# Patient Record
Sex: Female | Born: 1970 | Race: Black or African American | Hispanic: No | Marital: Married | Smoking: Never smoker
Health system: Southern US, Community
[De-identification: ages and names within clinical notes are randomized; demographics above are authoritative.]

## PROBLEM LIST (undated history)

## (undated) HISTORY — PX: ABDOMINAL HYSTERECTOMY: SHX81

---

## 2014-03-06 ENCOUNTER — Other Ambulatory Visit: Payer: Self-pay | Admitting: Family Medicine

## 2014-03-06 ENCOUNTER — Ambulatory Visit
Admission: RE | Admit: 2014-03-06 | Discharge: 2014-03-06 | Disposition: A | Discharge status: Home / Self Care | Payer: BC Managed Care – HMO | Source: Ambulatory Visit | Attending: Family Medicine | Admitting: Family Medicine

## 2014-03-06 DIAGNOSIS — M25552 Pain in left hip: Secondary | ICD-10-CM

## 2014-03-06 DIAGNOSIS — M25551 Pain in right hip: Secondary | ICD-10-CM

## 2014-03-12 ENCOUNTER — Ambulatory Visit: Payer: BC Managed Care – HMO | Attending: Family Medicine | Admitting: Physical Therapy

## 2014-03-12 DIAGNOSIS — R5381 Other malaise: Secondary | ICD-10-CM | POA: Diagnosis not present

## 2014-03-12 DIAGNOSIS — IMO0001 Reserved for inherently not codable concepts without codable children: Secondary | ICD-10-CM | POA: Diagnosis not present

## 2014-03-12 DIAGNOSIS — M25659 Stiffness of unspecified hip, not elsewhere classified: Secondary | ICD-10-CM | POA: Insufficient documentation

## 2014-03-12 DIAGNOSIS — M129 Arthropathy, unspecified: Secondary | ICD-10-CM | POA: Insufficient documentation

## 2014-03-12 DIAGNOSIS — M25559 Pain in unspecified hip: Secondary | ICD-10-CM | POA: Diagnosis not present

## 2014-03-26 ENCOUNTER — Ambulatory Visit: Payer: BC Managed Care – HMO | Admitting: Physical Therapy

## 2014-03-26 ENCOUNTER — Ambulatory Visit: Payer: BC Managed Care – HMO

## 2014-04-02 ENCOUNTER — Ambulatory Visit: Payer: BC Managed Care – HMO | Attending: Family Medicine | Admitting: Physical Therapy

## 2014-04-02 DIAGNOSIS — M25559 Pain in unspecified hip: Secondary | ICD-10-CM | POA: Diagnosis not present

## 2014-04-02 DIAGNOSIS — M25659 Stiffness of unspecified hip, not elsewhere classified: Secondary | ICD-10-CM | POA: Insufficient documentation

## 2014-04-02 DIAGNOSIS — M129 Arthropathy, unspecified: Secondary | ICD-10-CM | POA: Diagnosis not present

## 2014-04-02 DIAGNOSIS — R5381 Other malaise: Secondary | ICD-10-CM | POA: Insufficient documentation

## 2014-04-02 DIAGNOSIS — IMO0001 Reserved for inherently not codable concepts without codable children: Secondary | ICD-10-CM | POA: Insufficient documentation

## 2014-04-16 ENCOUNTER — Ambulatory Visit: Payer: BC Managed Care – HMO | Admitting: Physical Therapy

## 2014-04-16 DIAGNOSIS — IMO0001 Reserved for inherently not codable concepts without codable children: Secondary | ICD-10-CM | POA: Diagnosis not present

## 2014-04-22 ENCOUNTER — Ambulatory Visit: Payer: BC Managed Care – HMO | Admitting: Physical Therapy

## 2014-04-22 DIAGNOSIS — IMO0001 Reserved for inherently not codable concepts without codable children: Secondary | ICD-10-CM | POA: Diagnosis not present

## 2014-04-29 ENCOUNTER — Ambulatory Visit: Payer: BC Managed Care – HMO | Attending: Family Medicine

## 2014-04-29 DIAGNOSIS — IMO0001 Reserved for inherently not codable concepts without codable children: Secondary | ICD-10-CM | POA: Insufficient documentation

## 2014-04-29 DIAGNOSIS — R5381 Other malaise: Secondary | ICD-10-CM | POA: Insufficient documentation

## 2014-04-29 DIAGNOSIS — M25559 Pain in unspecified hip: Secondary | ICD-10-CM | POA: Insufficient documentation

## 2014-04-29 DIAGNOSIS — M25659 Stiffness of unspecified hip, not elsewhere classified: Secondary | ICD-10-CM | POA: Insufficient documentation

## 2014-04-29 DIAGNOSIS — M129 Arthropathy, unspecified: Secondary | ICD-10-CM | POA: Insufficient documentation

## 2014-05-06 ENCOUNTER — Ambulatory Visit: Payer: BC Managed Care – HMO | Admitting: Physical Therapy

## 2014-05-06 DIAGNOSIS — IMO0001 Reserved for inherently not codable concepts without codable children: Secondary | ICD-10-CM | POA: Diagnosis not present

## 2014-08-12 ENCOUNTER — Other Ambulatory Visit: Payer: Self-pay | Admitting: Family Medicine

## 2014-08-12 ENCOUNTER — Ambulatory Visit
Admission: RE | Admit: 2014-08-12 | Discharge: 2014-08-12 | Disposition: A | Discharge status: Home / Self Care | Payer: BC Managed Care – HMO | Source: Ambulatory Visit | Attending: Family Medicine | Admitting: Family Medicine

## 2014-08-12 DIAGNOSIS — S76012D Strain of muscle, fascia and tendon of left hip, subsequent encounter: Secondary | ICD-10-CM

## 2014-08-20 ENCOUNTER — Other Ambulatory Visit: Payer: Self-pay | Admitting: Family Medicine

## 2014-08-20 ENCOUNTER — Ambulatory Visit
Admission: RE | Admit: 2014-08-20 | Discharge: 2014-08-20 | Disposition: A | Discharge status: Home / Self Care | Payer: BC Managed Care – HMO | Source: Ambulatory Visit | Attending: Family Medicine | Admitting: Family Medicine

## 2014-08-20 DIAGNOSIS — M545 Low back pain: Secondary | ICD-10-CM

## 2014-09-02 ENCOUNTER — Ambulatory Visit: Payer: BLUE CROSS/BLUE SHIELD | Attending: Family Medicine | Admitting: Physical Therapy

## 2014-09-02 DIAGNOSIS — S39012D Strain of muscle, fascia and tendon of lower back, subsequent encounter: Secondary | ICD-10-CM | POA: Insufficient documentation

## 2014-09-02 DIAGNOSIS — M545 Low back pain: Secondary | ICD-10-CM | POA: Insufficient documentation

## 2014-09-08 ENCOUNTER — Ambulatory Visit: Payer: BLUE CROSS/BLUE SHIELD | Admitting: Physical Therapy

## 2014-09-08 DIAGNOSIS — S39012D Strain of muscle, fascia and tendon of lower back, subsequent encounter: Secondary | ICD-10-CM | POA: Diagnosis not present

## 2014-09-17 ENCOUNTER — Ambulatory Visit: Payer: BLUE CROSS/BLUE SHIELD | Admitting: Physical Therapy

## 2014-09-17 DIAGNOSIS — S39012D Strain of muscle, fascia and tendon of lower back, subsequent encounter: Secondary | ICD-10-CM | POA: Diagnosis not present

## 2014-09-21 ENCOUNTER — Ambulatory Visit: Payer: BLUE CROSS/BLUE SHIELD

## 2014-10-01 ENCOUNTER — Encounter: Payer: BLUE CROSS/BLUE SHIELD | Admitting: Physical Therapy

## 2014-10-08 ENCOUNTER — Encounter: Payer: BLUE CROSS/BLUE SHIELD | Admitting: Physical Therapy

## 2015-02-10 IMAGING — CR DG HIP (WITH OR WITHOUT PELVIS) 2-3V*L*
2 series · 2 of 2 positions shown · non-contrast
Comparison: None.

CLINICAL DATA: New onset left hip pain for 1 day.  No known injury.

EXAM:
LEFT HIP - COMPLETE 2+ VIEW

[t hip ap left]
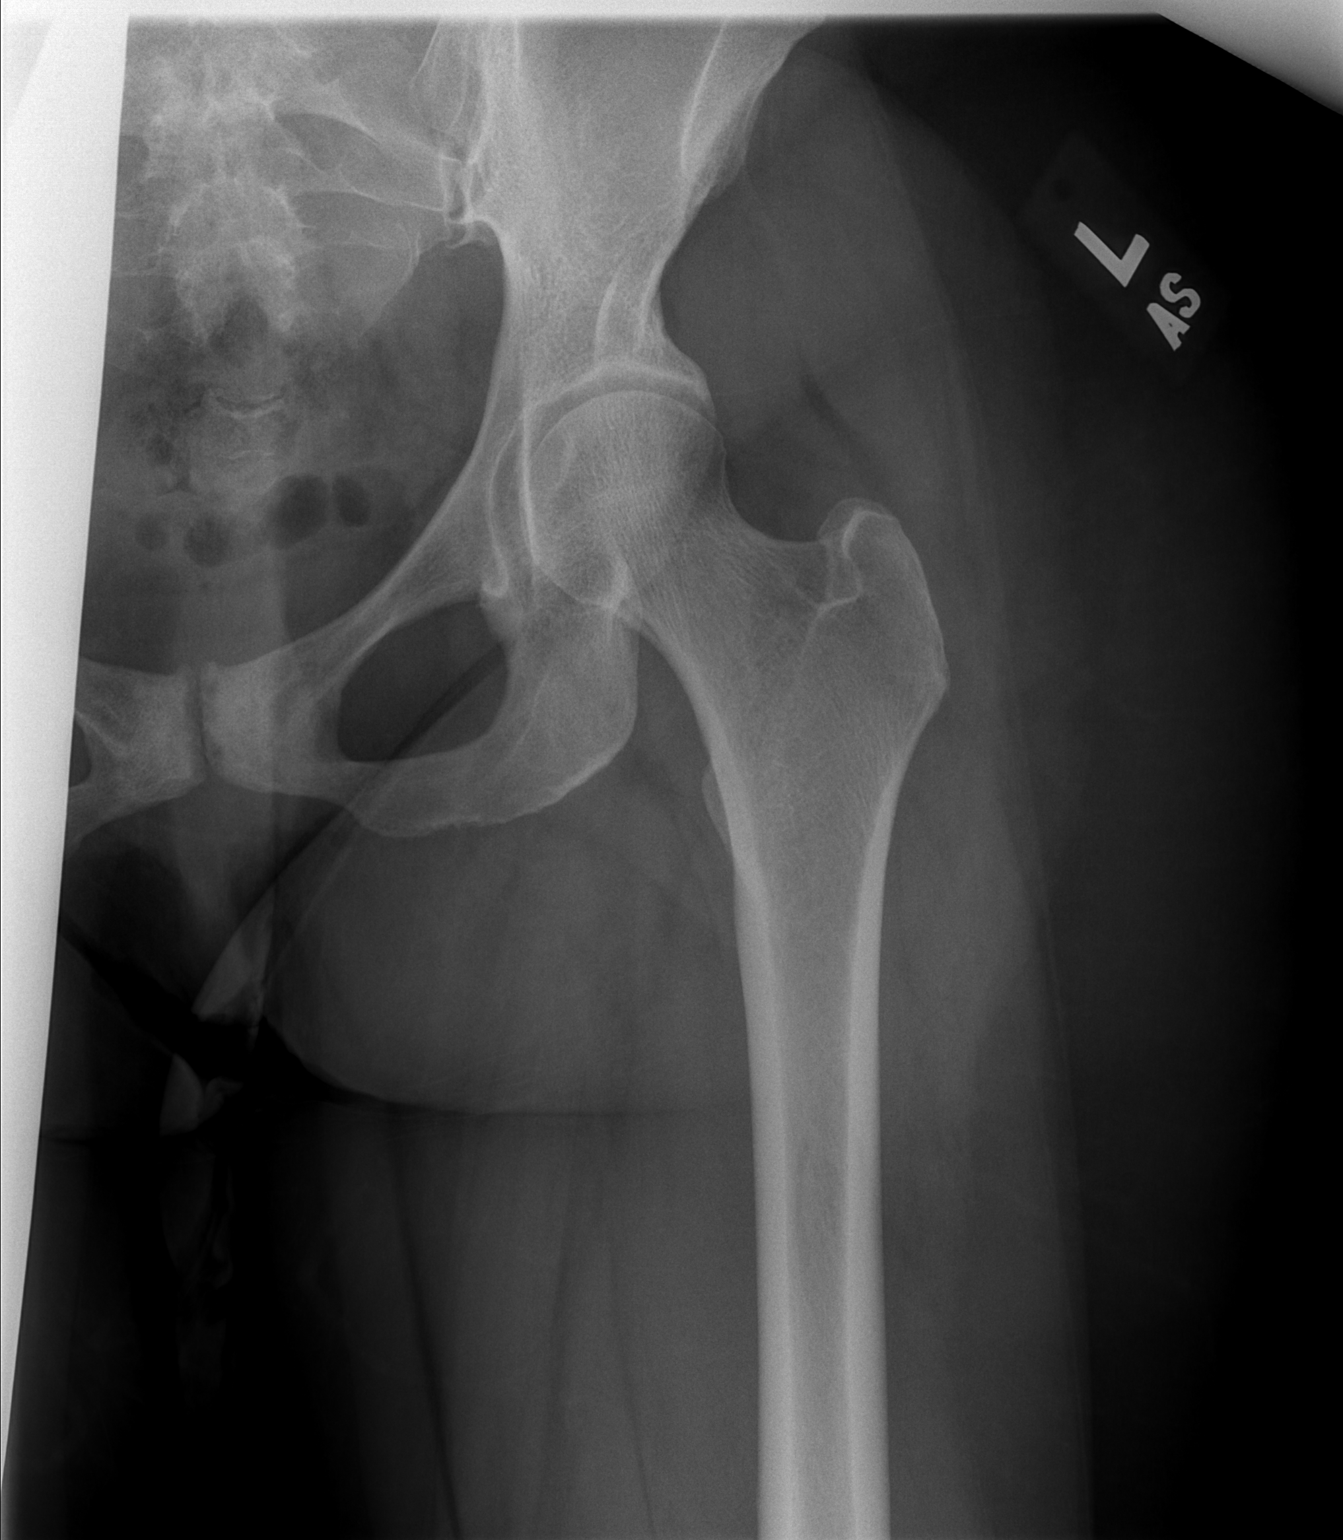

[t hip frog leg left]
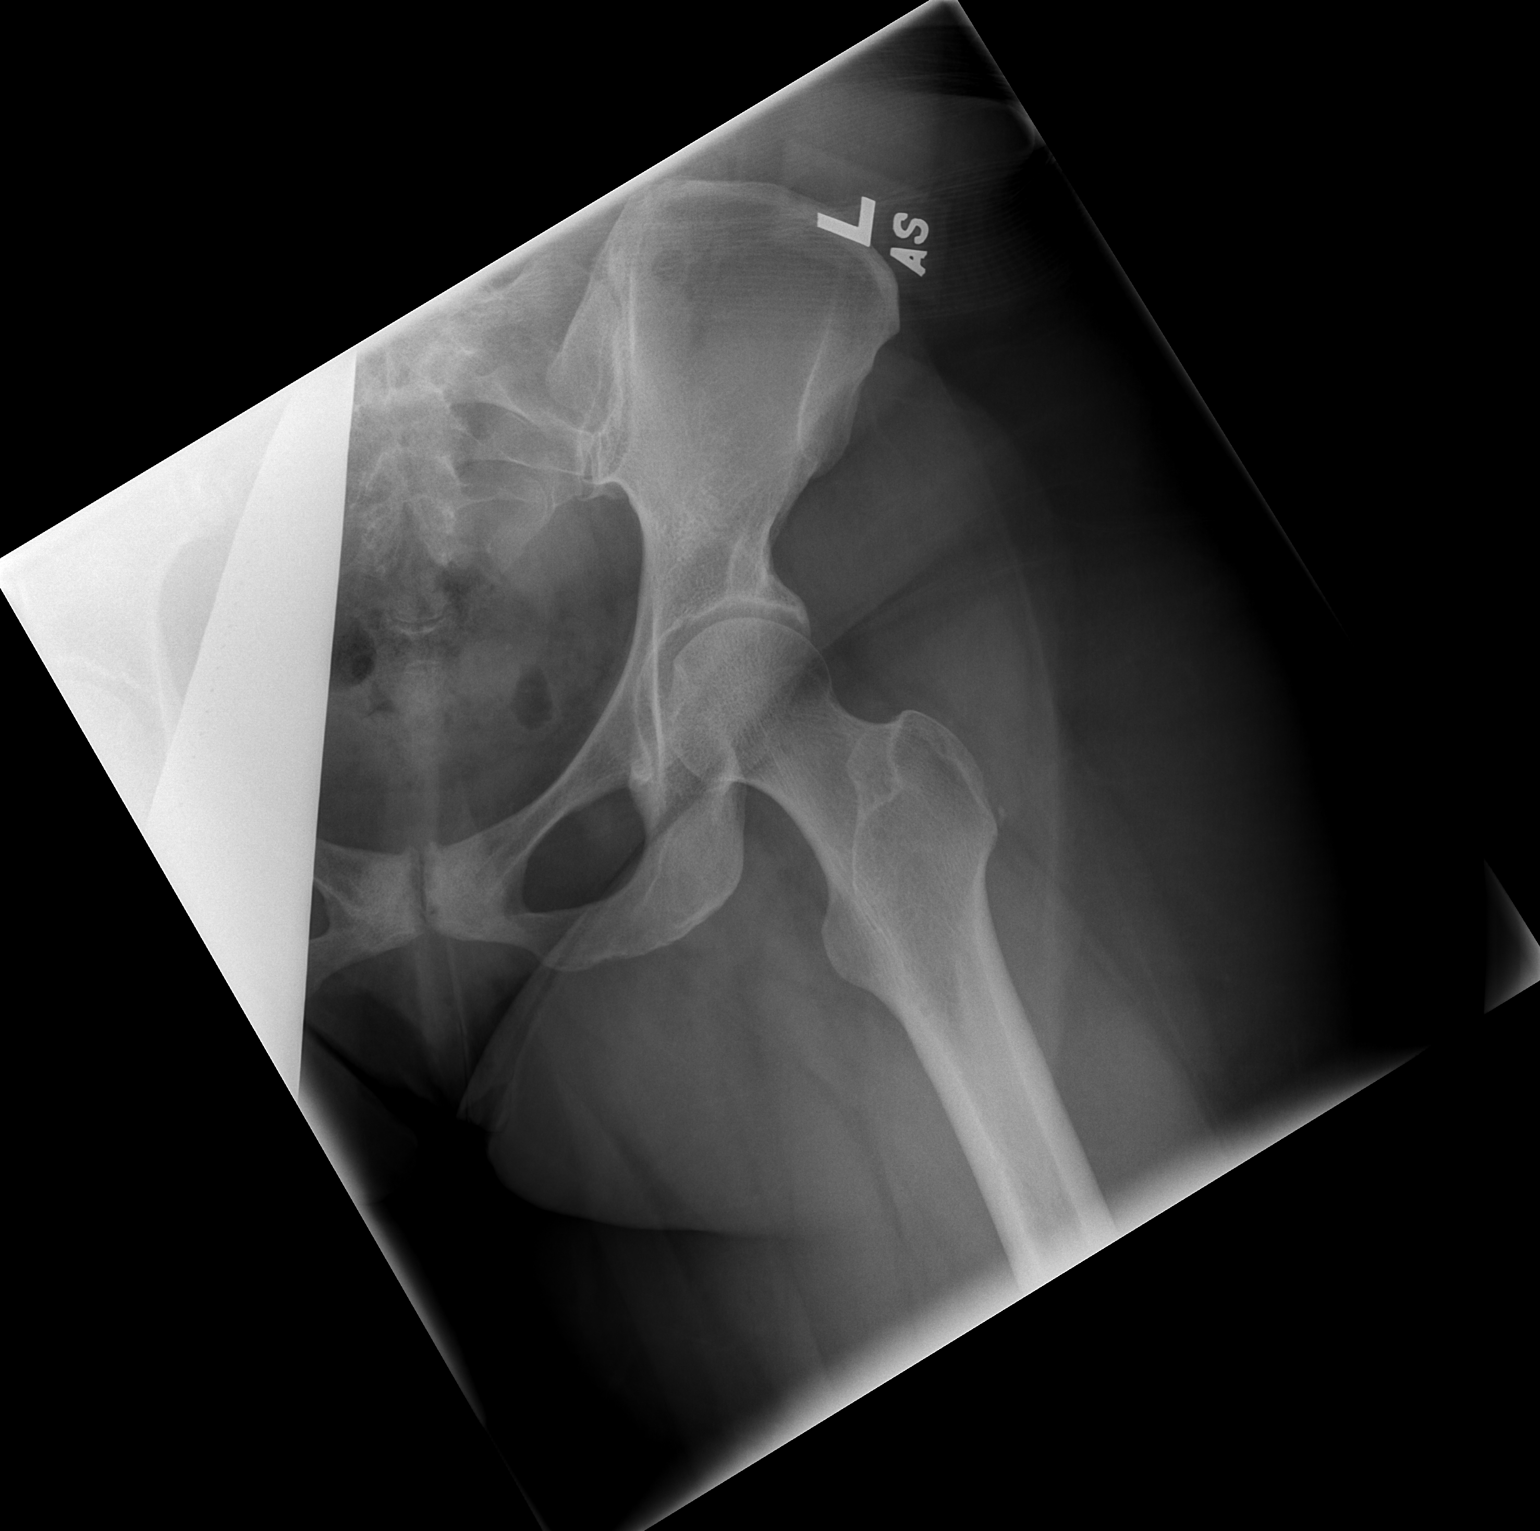

[2 of 2 positions shown; findings below may reference images not displayed]

FINDINGS: There is no evidence of hip fracture or dislocation. Left hip joint
is maintained. Sclerosis at the pubic symphysis. Visualized left SI
joint is normal.
IMPRESSION: No acute bony abnormality.

Osteitis pubis.

## 2015-05-28 ENCOUNTER — Ambulatory Visit (INDEPENDENT_AMBULATORY_CARE_PROVIDER_SITE_OTHER): Payer: BLUE CROSS/BLUE SHIELD | Admitting: Podiatry

## 2015-05-28 ENCOUNTER — Encounter: Payer: Self-pay | Admitting: Podiatry

## 2015-05-28 VITALS — BP 115/67 | HR 76 | Resp 16 | Ht 61.0 in | Wt 186.0 lb

## 2015-05-28 DIAGNOSIS — L600 Ingrowing nail: Secondary | ICD-10-CM | POA: Diagnosis not present

## 2015-05-28 MED ORDER — NEOMYCIN-POLYMYXIN-HC 1 % OT SOLN: OTIC | Status: AC

## 2015-05-28 NOTE — Progress Notes (Signed)
Subjective:     Patient ID: Tara Simon, female   DOB: 04-10-1971, 44 y.o.   MRN: [id number]  HPI patient presents with a painful ingrown toenail the right big toe medial border and states it's been present for a number of months and gradually getting worse. Patient states she's tried wider shoes soaks and trimming without relief   Review of Systems  All other systems reviewed and are negative.      Objective:   Physical Exam  Constitutional: She is oriented to person, place, and time.  Cardiovascular: Intact distal pulses.   Musculoskeletal: Normal range of motion.  Neurological: She is oriented to person, place, and time.  Skin: Skin is warm.  Nursing note and vitals reviewed.  neurovascular status found to be intact muscle strength adequate range of motion within normal limits with patient found to have incurvated right hallux medial border that's very painful when pressed with distal redness but no active drainage noted. Patient does have elongation of the big toe right and does have excellent digital perfusion with patient being well oriented 3     Assessment:     Ingrown toenail deformity right hallux medial border with no active infection    Plan:     H&P and condition reviewed with patient. Discussed risk of procedure and patient wants this performed and infiltrated the right hallux 60 mg Xylocaine Marcaine mixture removed medial border exposed matrix and applied phenol 3 applications 30 seconds followed by alcohol lavage and sterile dressing. They've instructions on soaks wrote prescription for Corticosporin otic solution and reappoint

## 2015-05-28 NOTE — Patient Instructions (Signed)

## 2015-05-28 NOTE — Progress Notes (Signed)
   Subjective:    Patient ID: Tara Simon, female    DOB: 1970-12-25, 44 y.o.   MRN: [id number]  HPI Comments: "I have an ingrown toenail"  Patient presents with: Toe Pain: 1st toe right - medial border, tender for few months, swollen, red, no treatment.    Toe Pain       Review of Systems  All other systems reviewed and are negative.      Objective:   Physical Exam        Assessment & Plan:

## 2015-05-31 ENCOUNTER — Telehealth: Payer: Self-pay | Admitting: *Deleted

## 2015-05-31 ENCOUNTER — Encounter: Payer: Self-pay | Admitting: *Deleted

## 2015-05-31 DIAGNOSIS — L600 Ingrowing nail: Secondary | ICD-10-CM | POA: Diagnosis not present

## 2015-05-31 MED ORDER — TRAMADOL HCL 50 MG PO TABS: 50.0000 mg | ORAL_TABLET | Freq: Three times a day (TID) | ORAL | Status: DC | PRN

## 2015-05-31 NOTE — Telephone Encounter (Addendum)
Pt complains of painful right 1st toe after ingrown procedure 05/28/2015, and work would not let her work in [employer], and she would like a surgical shoe.  Dr. Ardelle Anton prescribed Tramadol 50 mg #20 1 tablet every 12 hours, and ordered go into a surgical shoe.  Orders to pt and she will check with employer if she can wear surgical shoe.  Tramadol called to [facility] [phone].  Pt presents to pick up surgical shoe for work, and a note stating she is to wear the shoe for comfort about 2 weeks.  Letter given to pt.

## 2015-06-01 ENCOUNTER — Telehealth: Payer: Self-pay | Admitting: *Deleted

## 2015-06-01 NOTE — Telephone Encounter (Signed)
Left message for patient at [phone] (Home #) to call me back regarding their ingrown toenail procedure that was performed on Friday, May 28, 2015. Patient had requested me to call her office at [phone], but no one picks up the phone when I call. Pt works at [employer]. Waiting for a response.

## 2015-06-16 ENCOUNTER — Encounter: Payer: Self-pay | Admitting: Podiatry

## 2015-06-16 ENCOUNTER — Ambulatory Visit (INDEPENDENT_AMBULATORY_CARE_PROVIDER_SITE_OTHER): Payer: BLUE CROSS/BLUE SHIELD | Admitting: Podiatry

## 2015-06-16 VITALS — BP 112/69 | HR 83 | Resp 16

## 2015-06-16 DIAGNOSIS — L600 Ingrowing nail: Secondary | ICD-10-CM | POA: Diagnosis not present

## 2015-06-16 NOTE — Patient Instructions (Signed)

## 2015-06-18 ENCOUNTER — Telehealth: Payer: Self-pay | Admitting: *Deleted

## 2015-06-18 NOTE — Telephone Encounter (Signed)
Left message for patient at [phone] (Home #) to check to see how they were doing from their ingrown toenail procedure that was performed on Wednesday, June 16, 2015. Waiting for a response.

## 2015-06-21 NOTE — Progress Notes (Signed)
Subjective:     Patient ID: Tara Simon, female   DOB: May 18, 1971, 44 y.o.   MRN: [id number]  HPI patient presents stating I'm doing better but it is red and I was just concerned that I could have infection in this nailbed   Review of Systems     Objective:   Physical Exam Neurovascular status intact muscle strength adequate with localized redness of the right hallux medial border with no significant proximal edema erythema or drainage noted and discomfort when palpated    Assessment:     Ingrown toenail that's healing okay with mild paronychia like infection that's localized    Plan:     Instructed on continuation of soaking and drops and that if anything were to increase as far as redness swelling or drainage patient is to reappoint immediately

## 2016-10-20 DIAGNOSIS — F4321 Adjustment disorder with depressed mood: Secondary | ICD-10-CM | POA: Diagnosis not present

## 2016-11-17 DIAGNOSIS — F4321 Adjustment disorder with depressed mood: Secondary | ICD-10-CM | POA: Diagnosis not present

## 2016-11-27 ENCOUNTER — Ambulatory Visit
Admission: RE | Admit: 2016-11-27 | Discharge: 2016-11-27 | Disposition: A | Discharge status: Home / Self Care | Payer: BLUE CROSS/BLUE SHIELD | Source: Ambulatory Visit | Attending: Family Medicine | Admitting: Family Medicine

## 2016-11-27 ENCOUNTER — Other Ambulatory Visit: Payer: Self-pay | Admitting: Family Medicine

## 2016-11-27 DIAGNOSIS — M25551 Pain in right hip: Secondary | ICD-10-CM | POA: Diagnosis not present

## 2016-12-15 DIAGNOSIS — F4321 Adjustment disorder with depressed mood: Secondary | ICD-10-CM | POA: Diagnosis not present

## 2017-01-05 DIAGNOSIS — F4321 Adjustment disorder with depressed mood: Secondary | ICD-10-CM | POA: Diagnosis not present

## 2017-02-16 DIAGNOSIS — F4321 Adjustment disorder with depressed mood: Secondary | ICD-10-CM | POA: Diagnosis not present

## 2017-03-23 DIAGNOSIS — F4321 Adjustment disorder with depressed mood: Secondary | ICD-10-CM | POA: Diagnosis not present

## 2017-03-23 DIAGNOSIS — R509 Fever, unspecified: Secondary | ICD-10-CM | POA: Diagnosis not present

## 2017-04-20 DIAGNOSIS — F4321 Adjustment disorder with depressed mood: Secondary | ICD-10-CM | POA: Diagnosis not present

## 2017-06-15 DIAGNOSIS — Z23 Encounter for immunization: Secondary | ICD-10-CM | POA: Diagnosis not present

## 2017-07-27 DIAGNOSIS — Z1231 Encounter for screening mammogram for malignant neoplasm of breast: Secondary | ICD-10-CM | POA: Diagnosis not present

## 2017-08-08 DIAGNOSIS — Z0000 Encounter for general adult medical examination without abnormal findings: Secondary | ICD-10-CM | POA: Diagnosis not present

## 2017-08-08 DIAGNOSIS — Z13220 Encounter for screening for lipoid disorders: Secondary | ICD-10-CM | POA: Diagnosis not present

## 2017-08-16 DIAGNOSIS — Z0000 Encounter for general adult medical examination without abnormal findings: Secondary | ICD-10-CM | POA: Diagnosis not present

## 2017-08-16 DIAGNOSIS — Z23 Encounter for immunization: Secondary | ICD-10-CM | POA: Diagnosis not present

## 2017-09-07 DIAGNOSIS — F4321 Adjustment disorder with depressed mood: Secondary | ICD-10-CM | POA: Diagnosis not present

## 2017-09-21 DIAGNOSIS — L0292 Furuncle, unspecified: Secondary | ICD-10-CM | POA: Diagnosis not present

## 2017-09-21 DIAGNOSIS — Z01419 Encounter for gynecological examination (general) (routine) without abnormal findings: Secondary | ICD-10-CM | POA: Diagnosis not present

## 2017-11-14 DIAGNOSIS — L732 Hidradenitis suppurativa: Secondary | ICD-10-CM | POA: Diagnosis not present

## 2017-11-14 DIAGNOSIS — L0292 Furuncle, unspecified: Secondary | ICD-10-CM | POA: Diagnosis not present

## 2018-02-15 DIAGNOSIS — L732 Hidradenitis suppurativa: Secondary | ICD-10-CM | POA: Diagnosis not present

## 2018-03-05 ENCOUNTER — Other Ambulatory Visit: Payer: Self-pay | Admitting: Family Medicine

## 2018-03-05 DIAGNOSIS — K921 Melena: Secondary | ICD-10-CM | POA: Diagnosis not present

## 2018-03-05 DIAGNOSIS — R1084 Generalized abdominal pain: Secondary | ICD-10-CM

## 2018-03-07 ENCOUNTER — Ambulatory Visit
Admission: RE | Admit: 2018-03-07 | Discharge: 2018-03-07 | Disposition: A | Discharge status: Home / Self Care | Payer: BLUE CROSS/BLUE SHIELD | Source: Ambulatory Visit | Attending: Family Medicine | Admitting: Family Medicine

## 2018-03-07 DIAGNOSIS — R1084 Generalized abdominal pain: Secondary | ICD-10-CM

## 2018-03-07 DIAGNOSIS — R197 Diarrhea, unspecified: Secondary | ICD-10-CM | POA: Diagnosis not present

## 2018-03-07 DIAGNOSIS — R109 Unspecified abdominal pain: Secondary | ICD-10-CM | POA: Diagnosis not present

## 2018-03-07 MED ORDER — IOPAMIDOL (ISOVUE-300) INJECTION 61%
100.0000 mL | Freq: Once | INTRAVENOUS | Status: AC | PRN
  Administered 2018-03-07: 100 mL via INTRAVENOUS

## 2018-03-11 ENCOUNTER — Other Ambulatory Visit: Payer: Self-pay | Admitting: Family Medicine

## 2018-03-12 ENCOUNTER — Other Ambulatory Visit: Payer: Self-pay | Admitting: Family Medicine

## 2018-03-12 DIAGNOSIS — K769 Liver disease, unspecified: Secondary | ICD-10-CM

## 2018-03-20 ENCOUNTER — Ambulatory Visit
Admission: RE | Admit: 2018-03-20 | Discharge: 2018-03-20 | Disposition: A | Discharge status: Home / Self Care | Payer: BLUE CROSS/BLUE SHIELD | Source: Ambulatory Visit | Attending: Family Medicine | Admitting: Family Medicine

## 2018-03-20 ENCOUNTER — Other Ambulatory Visit: Payer: Self-pay | Admitting: Family Medicine

## 2018-03-20 DIAGNOSIS — K769 Liver disease, unspecified: Secondary | ICD-10-CM

## 2018-03-20 DIAGNOSIS — K760 Fatty (change of) liver, not elsewhere classified: Secondary | ICD-10-CM | POA: Diagnosis not present

## 2018-06-21 DIAGNOSIS — Z23 Encounter for immunization: Secondary | ICD-10-CM | POA: Diagnosis not present

## 2018-07-19 DIAGNOSIS — R197 Diarrhea, unspecified: Secondary | ICD-10-CM | POA: Diagnosis not present

## 2018-07-19 DIAGNOSIS — R109 Unspecified abdominal pain: Secondary | ICD-10-CM | POA: Diagnosis not present

## 2018-07-22 DIAGNOSIS — R197 Diarrhea, unspecified: Secondary | ICD-10-CM | POA: Diagnosis not present

## 2018-08-01 DIAGNOSIS — Z1231 Encounter for screening mammogram for malignant neoplasm of breast: Secondary | ICD-10-CM | POA: Diagnosis not present

## 2018-08-02 DIAGNOSIS — A0472 Enterocolitis due to Clostridium difficile, not specified as recurrent: Secondary | ICD-10-CM | POA: Diagnosis not present

## 2018-08-16 DIAGNOSIS — L732 Hidradenitis suppurativa: Secondary | ICD-10-CM | POA: Diagnosis not present

## 2018-08-16 DIAGNOSIS — A0472 Enterocolitis due to Clostridium difficile, not specified as recurrent: Secondary | ICD-10-CM | POA: Diagnosis not present

## 2018-09-04 DIAGNOSIS — A0472 Enterocolitis due to Clostridium difficile, not specified as recurrent: Secondary | ICD-10-CM | POA: Diagnosis not present

## 2018-09-10 DIAGNOSIS — Z0000 Encounter for general adult medical examination without abnormal findings: Secondary | ICD-10-CM | POA: Diagnosis not present

## 2018-09-10 DIAGNOSIS — Z79899 Other long term (current) drug therapy: Secondary | ICD-10-CM | POA: Diagnosis not present

## 2018-09-19 DIAGNOSIS — Z0000 Encounter for general adult medical examination without abnormal findings: Secondary | ICD-10-CM | POA: Diagnosis not present

## 2018-09-19 DIAGNOSIS — R7301 Impaired fasting glucose: Secondary | ICD-10-CM | POA: Diagnosis not present

## 2018-09-25 DIAGNOSIS — R42 Dizziness and giddiness: Secondary | ICD-10-CM | POA: Diagnosis not present

## 2018-09-25 DIAGNOSIS — R1031 Right lower quadrant pain: Secondary | ICD-10-CM | POA: Diagnosis not present

## 2018-10-09 DIAGNOSIS — Z01419 Encounter for gynecological examination (general) (routine) without abnormal findings: Secondary | ICD-10-CM | POA: Diagnosis not present

## 2019-01-15 DIAGNOSIS — R21 Rash and other nonspecific skin eruption: Secondary | ICD-10-CM | POA: Diagnosis not present

## 2019-02-21 DIAGNOSIS — E559 Vitamin D deficiency, unspecified: Secondary | ICD-10-CM | POA: Diagnosis not present

## 2019-04-05 IMAGING — US US ABDOMEN LIMITED
1 series · 14 of 25 positions shown · non-contrast
Comparison: None.

CLINICAL DATA: Liver lesion on CT.

EXAM:
ULTRASOUND ABDOMEN LIMITED RIGHT UPPER QUADRANT

[Series 1: us abdomen limited · 0.20mm/px · 46 acquisitions, 14 frames shown]
[im 1/46]
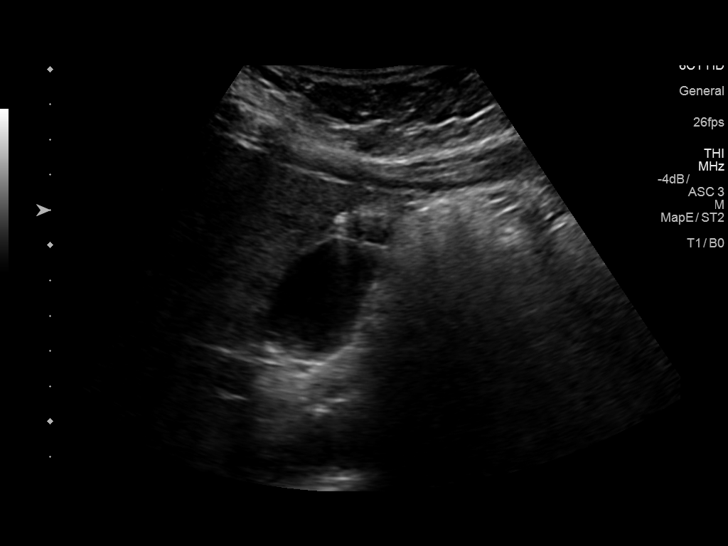
[im 4/46]
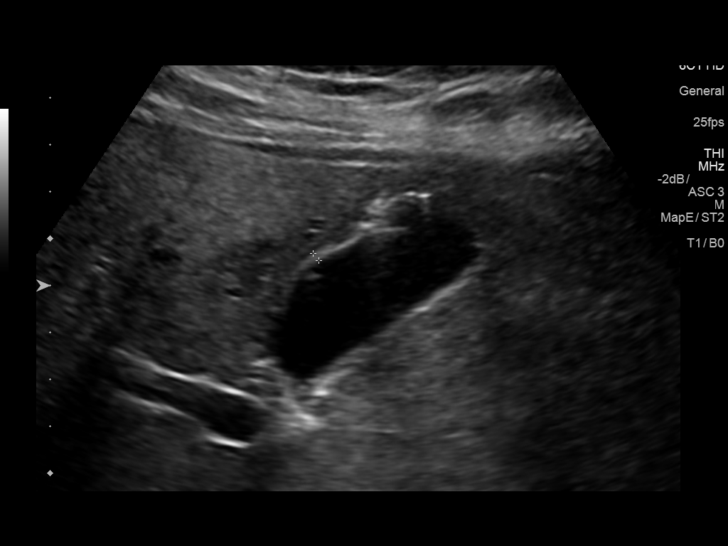
[im 8/46]
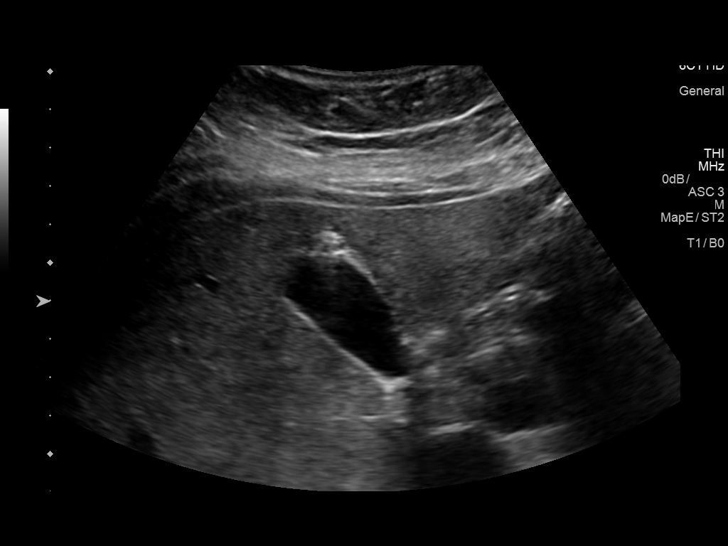
[im 12/46]
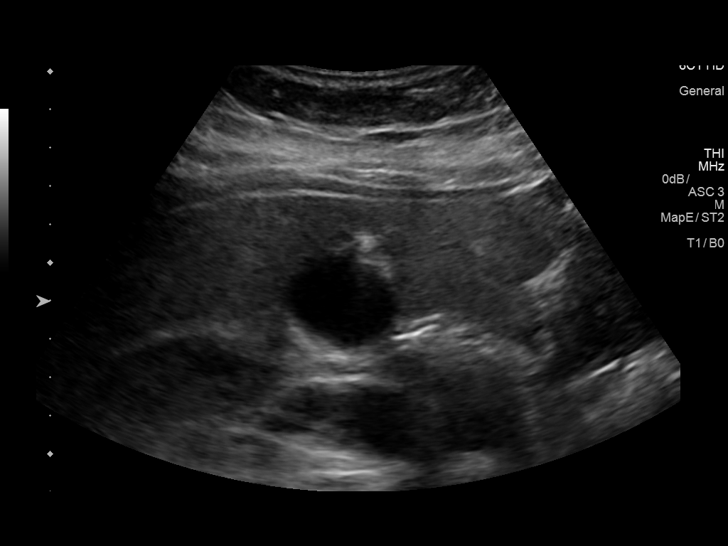
[im 16/46]
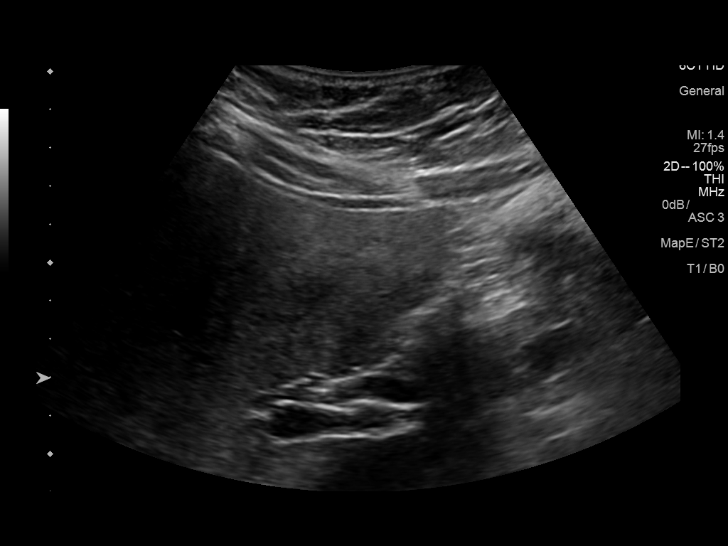
[im 17/46]
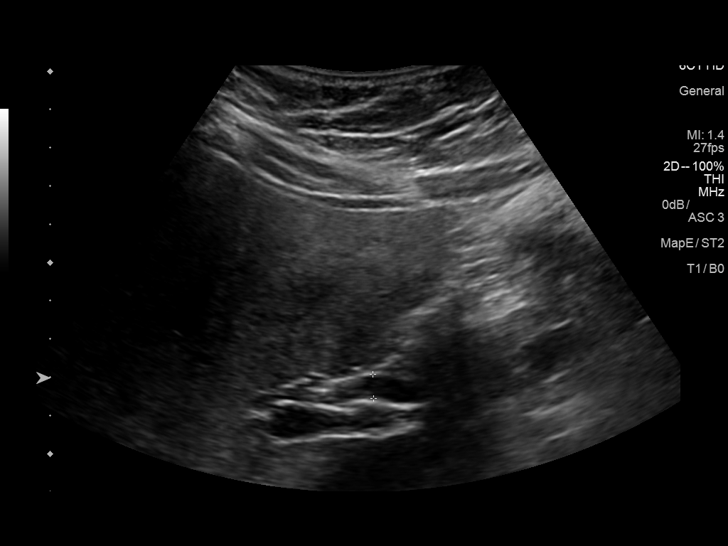
[im 21/46]
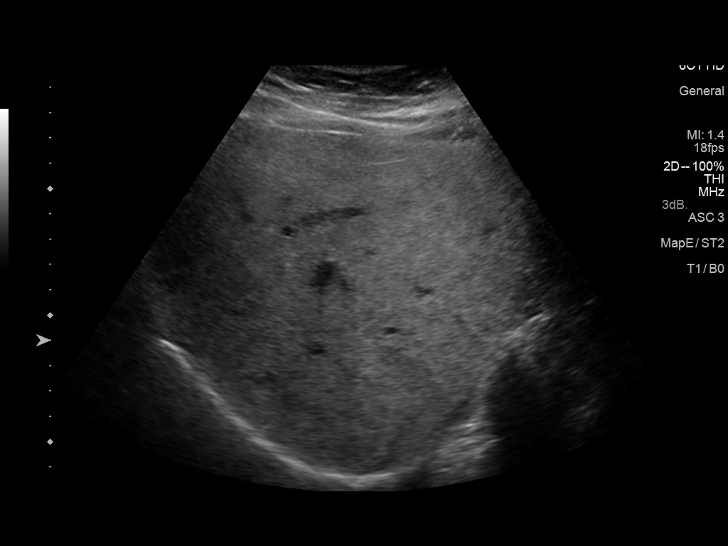
[im 25/46]
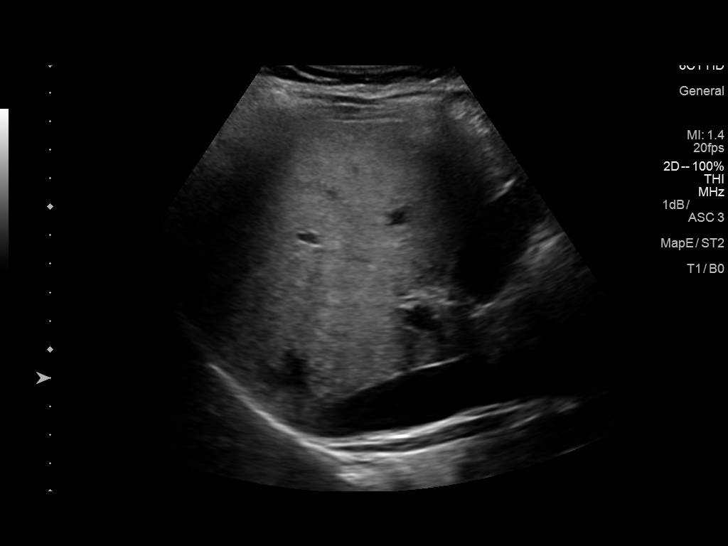
[im 29/46]
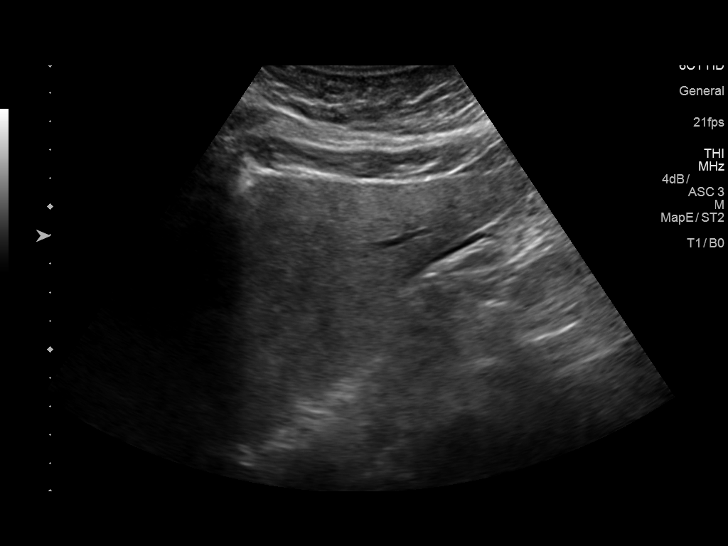
[im 31/46]
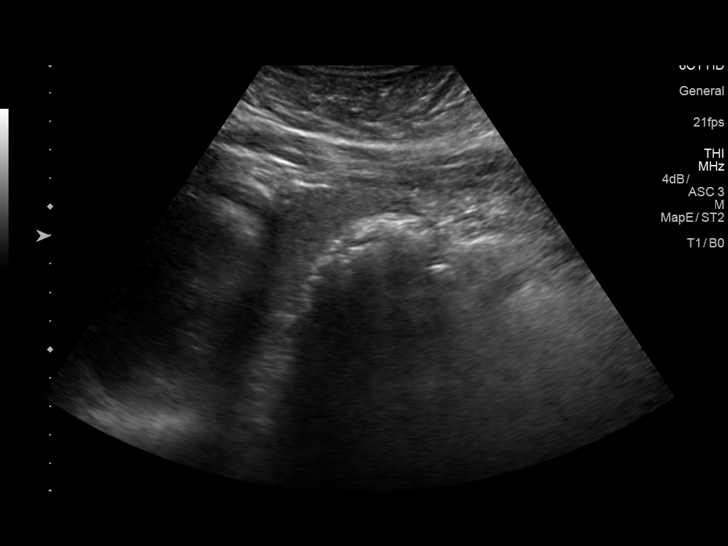
[im 34/46]
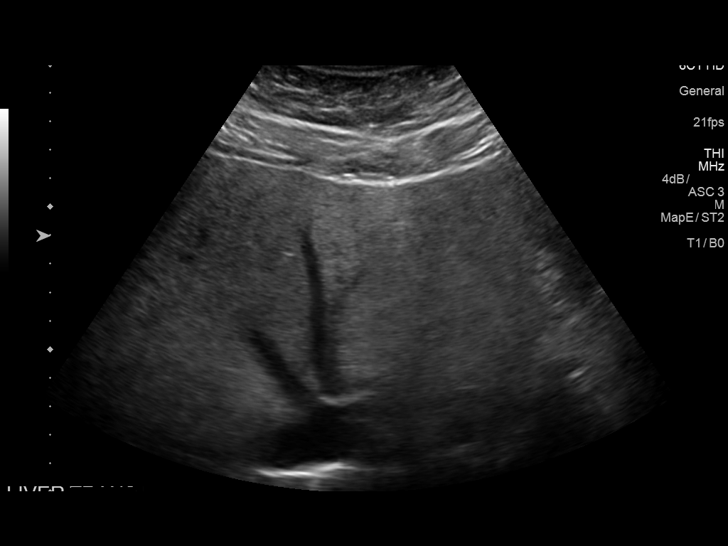
[im 38/46]
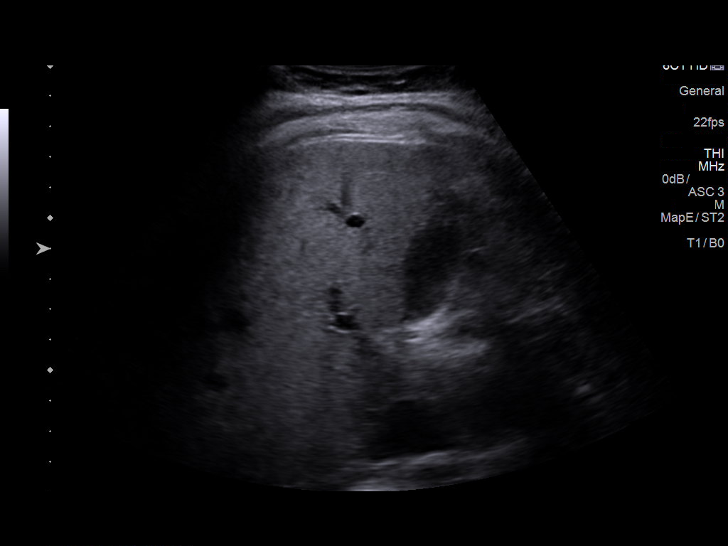
[im 42/46]
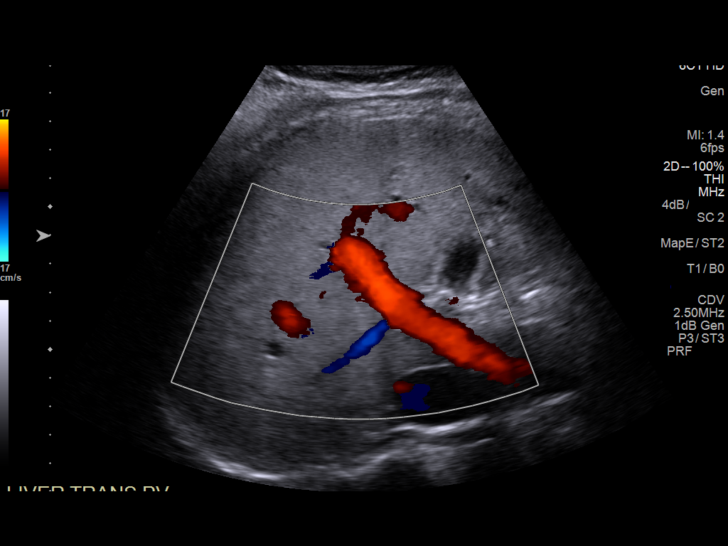
[im 46/46]
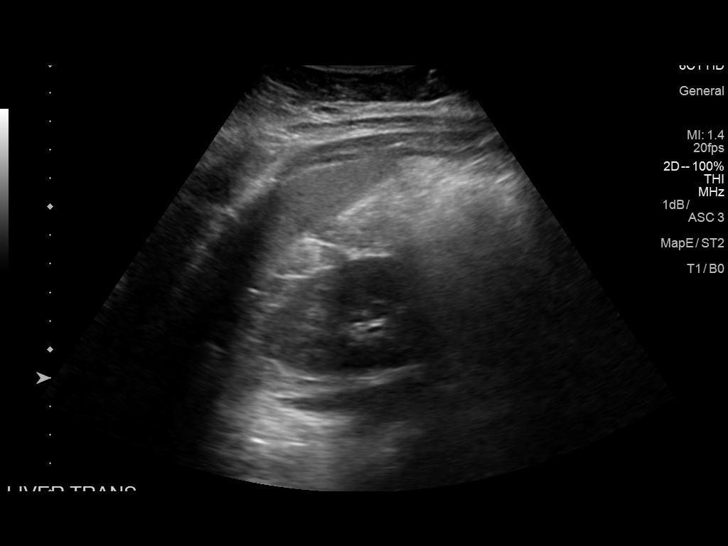

[14 of 25 positions shown; findings below may reference images not displayed]

FINDINGS: Gallbladder:

Ring down artifact noted from the anterior gallbladder wall
compatible with adenomyomatosis. No stones or wall thickening.
Negative sonographic [place].

Common bile duct:

Diameter: Normal caliber, 6 mm

Liver:

Diffusely increased echotexture throughout the liver compatible with
fatty infiltration. No abnormality noted in the area question on CT
adjacent to the falciform ligament. This most likely represented
focal fatty infiltration. No biliary ductal dilatation. Portal vein
is patent on color Doppler imaging with normal direction of blood
flow towards the liver.
IMPRESSION: Fatty infiltration of the liver. No visible focal abnormality in the
liver adjacent to the falciform ligament as seen on CT. This likely
reflected focal fatty infiltration.

Adenomyomatosis.

## 2019-06-20 DIAGNOSIS — Z23 Encounter for immunization: Secondary | ICD-10-CM | POA: Diagnosis not present

## 2019-08-08 DIAGNOSIS — Z1239 Encounter for other screening for malignant neoplasm of breast: Secondary | ICD-10-CM | POA: Diagnosis not present

## 2019-08-08 DIAGNOSIS — Z1231 Encounter for screening mammogram for malignant neoplasm of breast: Secondary | ICD-10-CM | POA: Diagnosis not present

## 2019-09-19 DIAGNOSIS — Z13220 Encounter for screening for lipoid disorders: Secondary | ICD-10-CM | POA: Diagnosis not present

## 2019-09-19 DIAGNOSIS — R7309 Other abnormal glucose: Secondary | ICD-10-CM | POA: Diagnosis not present

## 2019-09-19 DIAGNOSIS — Z0000 Encounter for general adult medical examination without abnormal findings: Secondary | ICD-10-CM | POA: Diagnosis not present

## 2019-09-19 DIAGNOSIS — E559 Vitamin D deficiency, unspecified: Secondary | ICD-10-CM | POA: Diagnosis not present

## 2019-09-22 DIAGNOSIS — N6002 Solitary cyst of left breast: Secondary | ICD-10-CM | POA: Diagnosis not present

## 2019-09-23 DIAGNOSIS — R0981 Nasal congestion: Secondary | ICD-10-CM | POA: Diagnosis not present

## 2019-09-23 DIAGNOSIS — R7309 Other abnormal glucose: Secondary | ICD-10-CM | POA: Diagnosis not present

## 2019-09-23 DIAGNOSIS — E559 Vitamin D deficiency, unspecified: Secondary | ICD-10-CM | POA: Diagnosis not present

## 2019-09-23 DIAGNOSIS — G4700 Insomnia, unspecified: Secondary | ICD-10-CM | POA: Diagnosis not present

## 2019-12-12 DIAGNOSIS — R600 Localized edema: Secondary | ICD-10-CM | POA: Diagnosis not present

## 2019-12-15 DIAGNOSIS — Z1151 Encounter for screening for human papillomavirus (HPV): Secondary | ICD-10-CM | POA: Diagnosis not present

## 2019-12-15 DIAGNOSIS — Z01419 Encounter for gynecological examination (general) (routine) without abnormal findings: Secondary | ICD-10-CM | POA: Diagnosis not present

## 2020-01-28 DIAGNOSIS — G44229 Chronic tension-type headache, not intractable: Secondary | ICD-10-CM | POA: Diagnosis not present

## 2020-02-12 DIAGNOSIS — G44209 Tension-type headache, unspecified, not intractable: Secondary | ICD-10-CM | POA: Diagnosis not present

## 2020-03-18 DIAGNOSIS — M25512 Pain in left shoulder: Secondary | ICD-10-CM | POA: Diagnosis not present

## 2020-03-22 DIAGNOSIS — M25512 Pain in left shoulder: Secondary | ICD-10-CM | POA: Diagnosis not present

## 2020-06-14 DIAGNOSIS — Z23 Encounter for immunization: Secondary | ICD-10-CM | POA: Diagnosis not present

## 2020-08-13 DIAGNOSIS — Z1239 Encounter for other screening for malignant neoplasm of breast: Secondary | ICD-10-CM | POA: Diagnosis not present

## 2020-08-13 DIAGNOSIS — Z1231 Encounter for screening mammogram for malignant neoplasm of breast: Secondary | ICD-10-CM | POA: Diagnosis not present

## 2020-11-26 DIAGNOSIS — G5711 Meralgia paresthetica, right lower limb: Secondary | ICD-10-CM | POA: Diagnosis not present

## 2020-12-14 DIAGNOSIS — M7502 Adhesive capsulitis of left shoulder: Secondary | ICD-10-CM | POA: Diagnosis not present

## 2020-12-16 DIAGNOSIS — M25512 Pain in left shoulder: Secondary | ICD-10-CM | POA: Diagnosis not present

## 2020-12-24 DIAGNOSIS — M7592 Shoulder lesion, unspecified, left shoulder: Secondary | ICD-10-CM | POA: Diagnosis not present

## 2020-12-24 DIAGNOSIS — M7582 Other shoulder lesions, left shoulder: Secondary | ICD-10-CM | POA: Diagnosis not present

## 2020-12-24 DIAGNOSIS — M62512 Muscle wasting and atrophy, not elsewhere classified, left shoulder: Secondary | ICD-10-CM | POA: Diagnosis not present

## 2020-12-24 DIAGNOSIS — M25512 Pain in left shoulder: Secondary | ICD-10-CM | POA: Diagnosis not present

## 2020-12-30 DIAGNOSIS — M62512 Muscle wasting and atrophy, not elsewhere classified, left shoulder: Secondary | ICD-10-CM | POA: Diagnosis not present

## 2020-12-30 DIAGNOSIS — M7592 Shoulder lesion, unspecified, left shoulder: Secondary | ICD-10-CM | POA: Diagnosis not present

## 2020-12-30 DIAGNOSIS — M7582 Other shoulder lesions, left shoulder: Secondary | ICD-10-CM | POA: Diagnosis not present

## 2020-12-30 DIAGNOSIS — M25512 Pain in left shoulder: Secondary | ICD-10-CM | POA: Diagnosis not present

## 2021-01-06 DIAGNOSIS — M7592 Shoulder lesion, unspecified, left shoulder: Secondary | ICD-10-CM | POA: Diagnosis not present

## 2021-01-06 DIAGNOSIS — M7582 Other shoulder lesions, left shoulder: Secondary | ICD-10-CM | POA: Diagnosis not present

## 2021-01-06 DIAGNOSIS — M25512 Pain in left shoulder: Secondary | ICD-10-CM | POA: Diagnosis not present

## 2021-01-06 DIAGNOSIS — M62512 Muscle wasting and atrophy, not elsewhere classified, left shoulder: Secondary | ICD-10-CM | POA: Diagnosis not present

## 2021-01-12 DIAGNOSIS — M7582 Other shoulder lesions, left shoulder: Secondary | ICD-10-CM | POA: Diagnosis not present

## 2021-01-12 DIAGNOSIS — M25512 Pain in left shoulder: Secondary | ICD-10-CM | POA: Diagnosis not present

## 2021-01-12 DIAGNOSIS — M62512 Muscle wasting and atrophy, not elsewhere classified, left shoulder: Secondary | ICD-10-CM | POA: Diagnosis not present

## 2021-01-12 DIAGNOSIS — M7592 Shoulder lesion, unspecified, left shoulder: Secondary | ICD-10-CM | POA: Diagnosis not present

## 2021-01-14 DIAGNOSIS — M25512 Pain in left shoulder: Secondary | ICD-10-CM | POA: Diagnosis not present

## 2021-01-26 DIAGNOSIS — M62512 Muscle wasting and atrophy, not elsewhere classified, left shoulder: Secondary | ICD-10-CM | POA: Diagnosis not present

## 2021-01-26 DIAGNOSIS — M25512 Pain in left shoulder: Secondary | ICD-10-CM | POA: Diagnosis not present

## 2021-01-26 DIAGNOSIS — M7582 Other shoulder lesions, left shoulder: Secondary | ICD-10-CM | POA: Diagnosis not present

## 2021-01-26 DIAGNOSIS — M7592 Shoulder lesion, unspecified, left shoulder: Secondary | ICD-10-CM | POA: Diagnosis not present

## 2021-01-28 DIAGNOSIS — E559 Vitamin D deficiency, unspecified: Secondary | ICD-10-CM | POA: Diagnosis not present

## 2021-01-28 DIAGNOSIS — Z13220 Encounter for screening for lipoid disorders: Secondary | ICD-10-CM | POA: Diagnosis not present

## 2021-01-28 DIAGNOSIS — R7309 Other abnormal glucose: Secondary | ICD-10-CM | POA: Diagnosis not present

## 2021-02-02 DIAGNOSIS — M7582 Other shoulder lesions, left shoulder: Secondary | ICD-10-CM | POA: Diagnosis not present

## 2021-02-02 DIAGNOSIS — M7592 Shoulder lesion, unspecified, left shoulder: Secondary | ICD-10-CM | POA: Diagnosis not present

## 2021-02-02 DIAGNOSIS — M25512 Pain in left shoulder: Secondary | ICD-10-CM | POA: Diagnosis not present

## 2021-02-02 DIAGNOSIS — M62512 Muscle wasting and atrophy, not elsewhere classified, left shoulder: Secondary | ICD-10-CM | POA: Diagnosis not present

## 2021-02-21 DIAGNOSIS — Z01419 Encounter for gynecological examination (general) (routine) without abnormal findings: Secondary | ICD-10-CM | POA: Diagnosis not present

## 2021-02-21 DIAGNOSIS — R102 Pelvic and perineal pain: Secondary | ICD-10-CM | POA: Diagnosis not present

## 2021-03-09 DIAGNOSIS — Z90710 Acquired absence of both cervix and uterus: Secondary | ICD-10-CM | POA: Diagnosis not present

## 2021-03-09 DIAGNOSIS — R102 Pelvic and perineal pain: Secondary | ICD-10-CM | POA: Diagnosis not present

## 2021-04-15 DIAGNOSIS — Z0000 Encounter for general adult medical examination without abnormal findings: Secondary | ICD-10-CM | POA: Diagnosis not present

## 2021-04-29 DIAGNOSIS — L03032 Cellulitis of left toe: Secondary | ICD-10-CM | POA: Diagnosis not present

## 2021-06-17 DIAGNOSIS — Z23 Encounter for immunization: Secondary | ICD-10-CM | POA: Diagnosis not present

## 2021-09-02 DIAGNOSIS — Z1231 Encounter for screening mammogram for malignant neoplasm of breast: Secondary | ICD-10-CM | POA: Diagnosis not present

## 2021-12-23 DIAGNOSIS — M545 Low back pain, unspecified: Secondary | ICD-10-CM | POA: Diagnosis not present

## 2022-02-23 DIAGNOSIS — N3946 Mixed incontinence: Secondary | ICD-10-CM | POA: Diagnosis not present

## 2022-02-23 DIAGNOSIS — Z01419 Encounter for gynecological examination (general) (routine) without abnormal findings: Secondary | ICD-10-CM | POA: Diagnosis not present

## 2022-04-13 DIAGNOSIS — R351 Nocturia: Secondary | ICD-10-CM | POA: Diagnosis not present

## 2022-04-13 DIAGNOSIS — N3946 Mixed incontinence: Secondary | ICD-10-CM | POA: Diagnosis not present

## 2022-04-13 DIAGNOSIS — N3281 Overactive bladder: Secondary | ICD-10-CM | POA: Diagnosis not present

## 2022-04-13 DIAGNOSIS — R8290 Unspecified abnormal findings in urine: Secondary | ICD-10-CM | POA: Diagnosis not present

## 2022-04-24 DIAGNOSIS — R7303 Prediabetes: Secondary | ICD-10-CM | POA: Diagnosis not present

## 2022-04-24 DIAGNOSIS — Z13220 Encounter for screening for lipoid disorders: Secondary | ICD-10-CM | POA: Diagnosis not present

## 2022-04-28 DIAGNOSIS — Z0000 Encounter for general adult medical examination without abnormal findings: Secondary | ICD-10-CM | POA: Diagnosis not present

## 2022-05-18 DIAGNOSIS — M545 Low back pain, unspecified: Secondary | ICD-10-CM | POA: Diagnosis not present

## 2022-05-18 DIAGNOSIS — M25562 Pain in left knee: Secondary | ICD-10-CM | POA: Diagnosis not present

## 2022-05-23 DIAGNOSIS — M25562 Pain in left knee: Secondary | ICD-10-CM | POA: Diagnosis not present

## 2022-06-16 DIAGNOSIS — M25562 Pain in left knee: Secondary | ICD-10-CM | POA: Diagnosis not present

## 2022-06-23 DIAGNOSIS — Z23 Encounter for immunization: Secondary | ICD-10-CM | POA: Diagnosis not present

## 2022-07-17 DIAGNOSIS — N3281 Overactive bladder: Secondary | ICD-10-CM | POA: Diagnosis not present

## 2022-07-17 DIAGNOSIS — N3946 Mixed incontinence: Secondary | ICD-10-CM | POA: Diagnosis not present

## 2022-07-17 DIAGNOSIS — R351 Nocturia: Secondary | ICD-10-CM | POA: Diagnosis not present

## 2022-09-21 DIAGNOSIS — Z1231 Encounter for screening mammogram for malignant neoplasm of breast: Secondary | ICD-10-CM | POA: Diagnosis not present

## 2022-11-02 DIAGNOSIS — J069 Acute upper respiratory infection, unspecified: Secondary | ICD-10-CM | POA: Diagnosis not present

## 2022-11-02 DIAGNOSIS — Z20822 Contact with and (suspected) exposure to covid-19: Secondary | ICD-10-CM | POA: Diagnosis not present

## 2023-01-25 DIAGNOSIS — Z9189 Other specified personal risk factors, not elsewhere classified: Secondary | ICD-10-CM | POA: Diagnosis not present

## 2023-01-25 DIAGNOSIS — Z566 Other physical and mental strain related to work: Secondary | ICD-10-CM | POA: Diagnosis not present

## 2023-01-25 DIAGNOSIS — R030 Elevated blood-pressure reading, without diagnosis of hypertension: Secondary | ICD-10-CM | POA: Diagnosis not present

## 2023-02-09 DIAGNOSIS — N3946 Mixed incontinence: Secondary | ICD-10-CM | POA: Diagnosis not present

## 2023-02-09 DIAGNOSIS — N3281 Overactive bladder: Secondary | ICD-10-CM | POA: Diagnosis not present

## 2023-03-20 DIAGNOSIS — H04213 Epiphora due to excess lacrimation, bilateral lacrimal glands: Secondary | ICD-10-CM | POA: Diagnosis not present

## 2023-03-30 DIAGNOSIS — Z01419 Encounter for gynecological examination (general) (routine) without abnormal findings: Secondary | ICD-10-CM | POA: Diagnosis not present

## 2023-04-12 DIAGNOSIS — H04213 Epiphora due to excess lacrimation, bilateral lacrimal glands: Secondary | ICD-10-CM | POA: Diagnosis not present

## 2023-04-17 DIAGNOSIS — N393 Stress incontinence (female) (male): Secondary | ICD-10-CM | POA: Diagnosis not present

## 2023-04-17 DIAGNOSIS — N3289 Other specified disorders of bladder: Secondary | ICD-10-CM | POA: Diagnosis not present

## 2023-04-27 DIAGNOSIS — E559 Vitamin D deficiency, unspecified: Secondary | ICD-10-CM | POA: Diagnosis not present

## 2023-04-27 DIAGNOSIS — R7303 Prediabetes: Secondary | ICD-10-CM | POA: Diagnosis not present

## 2023-04-27 DIAGNOSIS — Z13220 Encounter for screening for lipoid disorders: Secondary | ICD-10-CM | POA: Diagnosis not present

## 2023-05-03 DIAGNOSIS — N3281 Overactive bladder: Secondary | ICD-10-CM | POA: Diagnosis not present

## 2023-05-03 DIAGNOSIS — N393 Stress incontinence (female) (male): Secondary | ICD-10-CM | POA: Diagnosis not present

## 2023-05-03 DIAGNOSIS — Z48816 Encounter for surgical aftercare following surgery on the genitourinary system: Secondary | ICD-10-CM | POA: Diagnosis not present

## 2023-05-04 DIAGNOSIS — G542 Cervical root disorders, not elsewhere classified: Secondary | ICD-10-CM | POA: Diagnosis not present

## 2023-05-04 DIAGNOSIS — Z0000 Encounter for general adult medical examination without abnormal findings: Secondary | ICD-10-CM | POA: Diagnosis not present

## 2023-05-04 DIAGNOSIS — D649 Anemia, unspecified: Secondary | ICD-10-CM | POA: Diagnosis not present

## 2023-05-04 DIAGNOSIS — E559 Vitamin D deficiency, unspecified: Secondary | ICD-10-CM | POA: Diagnosis not present

## 2023-05-07 ENCOUNTER — Encounter: Payer: Self-pay | Admitting: Neurology

## 2023-05-08 ENCOUNTER — Other Ambulatory Visit: Payer: Self-pay

## 2023-05-08 DIAGNOSIS — R202 Paresthesia of skin: Secondary | ICD-10-CM

## 2023-06-01 ENCOUNTER — Ambulatory Visit: Payer: BC Managed Care – PPO | Admitting: Neurology

## 2023-06-01 DIAGNOSIS — M5412 Radiculopathy, cervical region: Secondary | ICD-10-CM

## 2023-06-01 DIAGNOSIS — G5601 Carpal tunnel syndrome, right upper limb: Secondary | ICD-10-CM

## 2023-06-01 DIAGNOSIS — R202 Paresthesia of skin: Secondary | ICD-10-CM

## 2023-06-01 NOTE — Procedures (Signed)
  [facility]  [address] [place], [address]  [place], [place] [address] Tel: [phone] Fax: [phone] Test Date:  06/01/2023  Patient: Tara Simon DOB: [birth year] Physician: Nita Sickle, DO  Sex: Female Height: 5\' 1"  Ref Phys: Darrow Bussing, MD  ID#: [id number]   Technician:    History: This is a 52 year old female referred for evaluation of right arm paresthesias.  NCV & EMG Findings: Extensive electrodiagnostic testing of the right upper extremity shows:  Right mixed palmar sensory responses show prolonged latency.  Right median and ulnar sensory responses are within normal limits. Right median and ulnar motor responses are within normal limits. Chronic motor axonal loss changes are seen affecting the right first dorsal interosseous and extensor indicis proprius muscle, without accompanying active denervation.  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 Right median neuropathy at or distal to the wrist, consistent with a clinical diagnosis of carpal tunnel syndrome.  Overall, these findings are very mild in degree electrically.   ___________________________ Nita Sickle, DO    Nerve Conduction Studies   Stim Site NR Peak (ms) Norm Peak (ms) O-P Amp (V) Norm O-P Amp  Right Median Anti Sensory (2nd Digit)  32 C  Wrist    3.2 <3.6 71.1 >15  Right Ulnar Anti Sensory (5th Digit)  32 C  Wrist    2.4 <3.1 67.1 >10     Stim Site NR Onset (ms) Norm Onset (ms) O-P Amp (mV) Norm O-P Amp Site1 Site2 Delta-0 (ms) Dist (cm) Vel (m/s) Norm Vel (m/s)  Right Median Motor (Abd Poll Brev)  32 C  Wrist    3.7 <4.0 14.1 >6 Elbow Wrist 4.5 27.0 60 >50  Elbow    8.2  12.4         Right Ulnar Motor (Abd Dig Minimi)  32 C  Wrist    2.3 <3.1 11.0 >7 B Elbow Wrist 3.5 21.0 60 >50  B Elbow    5.8  10.9  A Elbow B Elbow 1.5 10.0 67 >50  A Elbow    7.3  10.5            Stim Site NR Peak (ms) Norm Peak (ms) P-T Amp (V) Site1 Site2 Delta-P (ms)  Norm Delta (ms)  Right Median/Ulnar Palm Comparison (Wrist - 8cm)  32 C  Median Palm    *2.3 <2.2 45.5 Median Palm Ulnar Palm *1.1   Ulnar Palm    1.2 <2.2 14.6       Electromyography   Side Muscle Ins.Act Fibs Fasc Recrt Amp Dur Poly Activation Comment  Right 1stDorInt Nml Nml Nml *1- *1+ *1+ *1+ Nml N/A  Right Abd Poll Brev Nml Nml Nml Nml Nml Nml Nml Nml N/A  Right PronatorTeres Nml Nml Nml Nml Nml Nml Nml Nml N/A  Right Biceps Nml Nml Nml Nml Nml Nml Nml Nml N/A  Right Triceps Nml Nml Nml Nml Nml Nml Nml Nml N/A  Right Deltoid Nml Nml Nml Nml Nml Nml Nml Nml N/A  Right Ext Indicis Nml Nml Nml *1- *1+ *1+ *1+ Nml N/A      Waveforms:

## 2023-06-04 DIAGNOSIS — N3281 Overactive bladder: Secondary | ICD-10-CM | POA: Diagnosis not present

## 2023-06-04 DIAGNOSIS — N393 Stress incontinence (female) (male): Secondary | ICD-10-CM | POA: Diagnosis not present

## 2023-06-11 ENCOUNTER — Other Ambulatory Visit: Payer: Self-pay | Admitting: Family Medicine

## 2023-06-11 DIAGNOSIS — R94131 Abnormal electromyogram [EMG]: Secondary | ICD-10-CM

## 2023-06-11 DIAGNOSIS — M5412 Radiculopathy, cervical region: Secondary | ICD-10-CM

## 2023-06-11 DIAGNOSIS — L918 Other hypertrophic disorders of the skin: Secondary | ICD-10-CM | POA: Diagnosis not present

## 2023-06-26 DIAGNOSIS — M79645 Pain in left finger(s): Secondary | ICD-10-CM | POA: Diagnosis not present

## 2023-06-29 DIAGNOSIS — Z23 Encounter for immunization: Secondary | ICD-10-CM | POA: Diagnosis not present

## 2023-07-01 ENCOUNTER — Ambulatory Visit
Admission: RE | Admit: 2023-07-01 | Discharge: 2023-07-01 | Disposition: A | Discharge status: Home / Self Care | Payer: BC Managed Care – PPO | Source: Ambulatory Visit | Attending: Family Medicine | Admitting: Family Medicine

## 2023-07-01 DIAGNOSIS — M4802 Spinal stenosis, cervical region: Secondary | ICD-10-CM | POA: Diagnosis not present

## 2023-07-01 DIAGNOSIS — M436 Torticollis: Secondary | ICD-10-CM | POA: Diagnosis not present

## 2023-07-01 DIAGNOSIS — M5412 Radiculopathy, cervical region: Secondary | ICD-10-CM

## 2023-07-01 DIAGNOSIS — R94131 Abnormal electromyogram [EMG]: Secondary | ICD-10-CM

## 2023-07-02 DIAGNOSIS — H04562 Stenosis of left lacrimal punctum: Secondary | ICD-10-CM | POA: Diagnosis not present

## 2023-07-02 DIAGNOSIS — H0279 Other degenerative disorders of eyelid and periocular area: Secondary | ICD-10-CM | POA: Diagnosis not present

## 2023-07-02 DIAGNOSIS — H04222 Epiphora due to insufficient drainage, left lacrimal gland: Secondary | ICD-10-CM | POA: Diagnosis not present

## 2023-07-20 DIAGNOSIS — H04222 Epiphora due to insufficient drainage, left lacrimal gland: Secondary | ICD-10-CM | POA: Diagnosis not present

## 2023-07-20 DIAGNOSIS — H0279 Other degenerative disorders of eyelid and periocular area: Secondary | ICD-10-CM | POA: Diagnosis not present

## 2023-08-06 DIAGNOSIS — D649 Anemia, unspecified: Secondary | ICD-10-CM | POA: Diagnosis not present

## 2023-08-10 DIAGNOSIS — E119 Type 2 diabetes mellitus without complications: Secondary | ICD-10-CM | POA: Diagnosis not present

## 2023-08-20 DIAGNOSIS — R059 Cough, unspecified: Secondary | ICD-10-CM | POA: Diagnosis not present

## 2023-08-20 DIAGNOSIS — U071 COVID-19: Secondary | ICD-10-CM | POA: Diagnosis not present

## 2023-08-31 DIAGNOSIS — M5412 Radiculopathy, cervical region: Secondary | ICD-10-CM | POA: Diagnosis not present

## 2023-08-31 DIAGNOSIS — M542 Cervicalgia: Secondary | ICD-10-CM | POA: Diagnosis not present

## 2023-09-17 DIAGNOSIS — M542 Cervicalgia: Secondary | ICD-10-CM | POA: Diagnosis not present

## 2023-09-24 DIAGNOSIS — Z1231 Encounter for screening mammogram for malignant neoplasm of breast: Secondary | ICD-10-CM | POA: Diagnosis not present

## 2023-10-19 DIAGNOSIS — R921 Mammographic calcification found on diagnostic imaging of breast: Secondary | ICD-10-CM | POA: Diagnosis not present

## 2023-10-19 DIAGNOSIS — R928 Other abnormal and inconclusive findings on diagnostic imaging of breast: Secondary | ICD-10-CM | POA: Diagnosis not present

## 2023-10-19 DIAGNOSIS — R920 Mammographic microcalcification found on diagnostic imaging of breast: Secondary | ICD-10-CM | POA: Diagnosis not present

## 2023-10-19 DIAGNOSIS — R92333 Mammographic heterogeneous density, bilateral breasts: Secondary | ICD-10-CM | POA: Diagnosis not present

## 2024-02-01 DIAGNOSIS — E559 Vitamin D deficiency, unspecified: Secondary | ICD-10-CM | POA: Diagnosis not present

## 2024-02-01 DIAGNOSIS — E119 Type 2 diabetes mellitus without complications: Secondary | ICD-10-CM | POA: Diagnosis not present

## 2024-04-04 DIAGNOSIS — Z01419 Encounter for gynecological examination (general) (routine) without abnormal findings: Secondary | ICD-10-CM | POA: Diagnosis not present

## 2024-04-04 DIAGNOSIS — R232 Flushing: Secondary | ICD-10-CM | POA: Diagnosis not present

## 2024-05-05 DIAGNOSIS — Z0000 Encounter for general adult medical examination without abnormal findings: Secondary | ICD-10-CM | POA: Diagnosis not present

## 2024-05-05 DIAGNOSIS — E119 Type 2 diabetes mellitus without complications: Secondary | ICD-10-CM | POA: Diagnosis not present

## 2024-05-05 DIAGNOSIS — D509 Iron deficiency anemia, unspecified: Secondary | ICD-10-CM | POA: Diagnosis not present

## 2024-05-08 DIAGNOSIS — Z0000 Encounter for general adult medical examination without abnormal findings: Secondary | ICD-10-CM | POA: Diagnosis not present

## 2024-05-08 DIAGNOSIS — R7401 Elevation of levels of liver transaminase levels: Secondary | ICD-10-CM | POA: Diagnosis not present

## 2024-05-08 DIAGNOSIS — D509 Iron deficiency anemia, unspecified: Secondary | ICD-10-CM | POA: Diagnosis not present

## 2024-07-16 DIAGNOSIS — Z23 Encounter for immunization: Secondary | ICD-10-CM | POA: Diagnosis not present
